# Patient Record
Sex: Female | Born: 1995 | Race: White | Hispanic: No | Marital: Single | Smoking: Never smoker
Health system: Southern US, Community
[De-identification: ages and names within clinical notes are randomized; demographics above are authoritative.]

---

## 2015-08-27 ENCOUNTER — Encounter (HOSPITAL_COMMUNITY): Payer: Self-pay | Admitting: Emergency Medicine

## 2015-08-27 ENCOUNTER — Emergency Department (HOSPITAL_COMMUNITY): Payer: Self-pay

## 2015-08-27 ENCOUNTER — Emergency Department (HOSPITAL_COMMUNITY)
Admission: EM | Admit: 2015-08-27 | Discharge: 2015-08-27 | Disposition: A | Discharge status: Home / Self Care | Payer: Self-pay | Attending: Emergency Medicine | Admitting: Emergency Medicine

## 2015-08-27 DIAGNOSIS — Y9389 Activity, other specified: Secondary | ICD-10-CM | POA: Insufficient documentation

## 2015-08-27 DIAGNOSIS — Y998 Other external cause status: Secondary | ICD-10-CM | POA: Insufficient documentation

## 2015-08-27 DIAGNOSIS — S01111A Laceration without foreign body of right eyelid and periocular area, initial encounter: Secondary | ICD-10-CM | POA: Insufficient documentation

## 2015-08-27 DIAGNOSIS — S199XXA Unspecified injury of neck, initial encounter: Secondary | ICD-10-CM | POA: Insufficient documentation

## 2015-08-27 DIAGNOSIS — S0181XA Laceration without foreign body of other part of head, initial encounter: Secondary | ICD-10-CM

## 2015-08-27 DIAGNOSIS — S3992XA Unspecified injury of lower back, initial encounter: Secondary | ICD-10-CM | POA: Insufficient documentation

## 2015-08-27 DIAGNOSIS — Y92410 Unspecified street and highway as the place of occurrence of the external cause: Secondary | ICD-10-CM | POA: Insufficient documentation

## 2015-08-27 MED ORDER — METHOCARBAMOL 500 MG PO TABS: 500.0000 mg | ORAL_TABLET | Freq: Two times a day (BID) | ORAL | Status: AC

## 2015-08-27 MED ORDER — NAPROXEN 500 MG PO TABS: 500.0000 mg | ORAL_TABLET | Freq: Two times a day (BID) | ORAL | Status: AC
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
